# Patient Record
Sex: Female | Born: 1980 | Race: White | Hispanic: No | Marital: Single | Smoking: Former smoker
Health system: Southern US, Community
[De-identification: ages and names within clinical notes are randomized; demographics above are authoritative.]

## PROBLEM LIST (undated history)

## (undated) DIAGNOSIS — F419 Anxiety disorder, unspecified: Secondary | ICD-10-CM

## (undated) DIAGNOSIS — F1920 Other psychoactive substance dependence, uncomplicated: Secondary | ICD-10-CM

## (undated) DIAGNOSIS — B1920 Unspecified viral hepatitis C without hepatic coma: Secondary | ICD-10-CM

## (undated) DIAGNOSIS — F329 Major depressive disorder, single episode, unspecified: Secondary | ICD-10-CM

## (undated) DIAGNOSIS — F32A Depression, unspecified: Secondary | ICD-10-CM

## (undated) HISTORY — DX: Other psychoactive substance dependence, uncomplicated: F19.20

## (undated) HISTORY — DX: Depression, unspecified: F32.A

## (undated) HISTORY — DX: Unspecified viral hepatitis C without hepatic coma: B19.20

## (undated) HISTORY — DX: Anxiety disorder, unspecified: F41.9

## (undated) HISTORY — DX: Major depressive disorder, single episode, unspecified: F32.9

---

## 2008-07-23 HISTORY — PX: CHOLECYSTECTOMY: SHX55

## 2013-05-11 LAB — HM HIV SCREENING LAB: HM HIV SCREENING: NEGATIVE

## 2013-05-13 LAB — HM PAP SMEAR: HM PAP: NEGATIVE

## 2013-05-14 LAB — HM HEPATITIS C SCREENING LAB: HM HEPATITIS C SCREENING: POSITIVE

## 2016-04-08 ENCOUNTER — Emergency Department (HOSPITAL_COMMUNITY): Payer: Self-pay

## 2016-04-08 ENCOUNTER — Emergency Department (HOSPITAL_COMMUNITY)
Admission: EM | Admit: 2016-04-08 | Discharge: 2016-04-08 | Disposition: A | Discharge status: Home / Self Care | Payer: Self-pay | Attending: Emergency Medicine | Admitting: Emergency Medicine

## 2016-04-08 ENCOUNTER — Encounter (HOSPITAL_COMMUNITY): Payer: Self-pay

## 2016-04-08 DIAGNOSIS — R0789 Other chest pain: Secondary | ICD-10-CM | POA: Insufficient documentation

## 2016-04-08 DIAGNOSIS — R079 Chest pain, unspecified: Secondary | ICD-10-CM

## 2016-04-08 DIAGNOSIS — F17200 Nicotine dependence, unspecified, uncomplicated: Secondary | ICD-10-CM | POA: Insufficient documentation

## 2016-04-08 DIAGNOSIS — B349 Viral infection, unspecified: Secondary | ICD-10-CM

## 2016-04-08 LAB — BASIC METABOLIC PANEL WITH GFR
Anion gap: 5 (ref 5–15)
BUN: 6 mg/dL (ref 6–20)
CO2: 24 mmol/L (ref 22–32)
Calcium: 9 mg/dL (ref 8.9–10.3)
Chloride: 106 mmol/L (ref 101–111)
Creatinine, Ser: 0.7 mg/dL (ref 0.44–1.00)
GFR calc Af Amer: >60 mL/min
GFR calc non Af Amer: >60 mL/min
Glucose, Bld: 110 mg/dL — ABNORMAL HIGH (ref 65–99)
Potassium: 4 mmol/L (ref 3.5–5.1)
Sodium: 135 mmol/L (ref 135–145)

## 2016-04-08 LAB — I-STAT TROPONIN, ED: Troponin i, poc: 0 ng/mL (ref 0.00–0.08)

## 2016-04-08 LAB — CBC
HEMATOCRIT: 41.3 % (ref 36.0–46.0)
HEMOGLOBIN: 14 g/dL (ref 12.0–15.0)
MCH: 30.5 pg (ref 26.0–34.0)
MCHC: 33.9 g/dL (ref 30.0–36.0)
MCV: 90 fL (ref 78.0–100.0)
Platelets: 237 10*3/uL (ref 150–400)
RBC: 4.59 MIL/uL (ref 3.87–5.11)
RDW: 11.8 % (ref 11.5–15.5)
WBC: 5.1 10*3/uL (ref 4.0–10.5)

## 2016-04-08 NOTE — ED Triage Notes (Signed)
Patient here with 3 days of chills, body aches and sharp chest pain since am. States that the pain is worse with inspiration. Denies cough, denies trauma

## 2016-04-08 NOTE — ED Provider Notes (Signed)
[facility] DEPT Provider Note   CSN: [id number] Arrival date & time: 04/08/16  1357   History   Chief Complaint Chief Complaint  Patient presents with  . Chest Pain  . Generalized Body Aches    HPI Yvonne Morse is a 35 y.o. female.  The history is provided by the patient.   35 yo female with no significant past medical or surgical history presenting with myalgias, subjective fever and chills, chest pain. Onset about 3 days ago. Worsening since then. Moderate severity. Worsened by exertion. Alleviated somewhat by resting. Described as diffuse body aches, hot and cold chills and subjective fevers, a slight feeling of shortness of breath without cough or rhinorrhea, and 2 days of a middle chest tightness. 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 No leg swelling, history of DVT/PE, or estrogen use. No recent surgeries or immobilization.   History reviewed. No pertinent past medical history.  There are no active problems to display for this patient.   History reviewed. No pertinent surgical history.  OB History    No data available       Home Medications    Prior to Admission medications   Not on File    Family History No family history on file.  Social History Social History  Substance Use Topics  . Smoking status: Current Every Day Smoker  . Smokeless tobacco: Never Used  . Alcohol use Not on file     Allergies   Review of patient's allergies indicates no known allergies.   Review of Systems Review of Systems  Constitutional: Positive for chills and fever.  HENT: Negative for rhinorrhea and sore throat.   Respiratory: Positive for shortness of breath.   Cardiovascular: Positive for chest pain. Negative for palpitations and leg swelling.  Gastrointestinal: Negative for abdominal pain, nausea and vomiting.  Skin: Negative for rash.  All other systems reviewed and are  negative.    Physical Exam Updated Vital Signs BP 105/70 (BP Location: Right Arm)   Pulse 80   Temp 98.5 F (36.9 C) (Oral)   Resp 20   SpO2 98%   Physical Exam  Constitutional: She appears well-developed and well-nourished. No distress.  HENT:  Head: Normocephalic and atraumatic.  Mouth/Throat: Oropharynx is clear and moist.  Eyes: Conjunctivae are normal.  Neck: Neck supple.  Cardiovascular: Normal rate and regular rhythm.   No murmur heard. Pulmonary/Chest: Effort normal and breath sounds normal. No respiratory distress.  Abdominal: Soft. There is no tenderness.  Musculoskeletal: She exhibits no edema.  Lymphadenopathy:    She has no cervical adenopathy.  Neurological: She is alert.  Skin: Skin is warm and dry.  Psychiatric: She has a normal mood and affect.  Nursing note and vitals reviewed.    ED Treatments / Results  Labs (all labs ordered are listed, but only abnormal results are displayed) Labs Reviewed  BASIC METABOLIC PANEL - Abnormal; Notable for the following:       Result Value   Glucose, Bld 110 (*)    All other components within normal limits  CBC  I-STAT TROPOININ, ED    EKG  EKG Interpretation None       Radiology Dg Chest 2 View  Result Date: 04/08/2016 CLINICAL DATA:  Chest pain for 4 days. EXAM: CHEST  2 VIEW COMPARISON:  None. FINDINGS: The cardiomediastinal silhouette is unremarkable. There is no evidence of focal airspace disease,  pulmonary edema, suspicious pulmonary nodule/mass, pleural effusion, or pneumothorax. No acute bony abnormalities are identified. IMPRESSION: No active cardiopulmonary disease. Electronically Signed   By: Harmon PierJeffrey  Hu M.D.   On: 04/08/2016 15:38    Procedures Procedures (including critical care time)  Medications Ordered in ED Medications - No data to display   Initial Impression / Assessment and Plan / ED Course  I have reviewed the triage vital signs and the nursing notes.  Pertinent labs & imaging  results that were available during my care of the patient were reviewed by me and considered in my medical decision making (see chart for details).  Clinical Course   35 yo female with no significant past medical or surgical history presenting with myalgias, subjective fever and chills, chest pain as above. She is afebrile with stable vital signs. Unremarkable exam. No symptoms currently. Chest x-ray without any acute disease. EKG without acute ischemic changes or abnormalities. She is low risk for DVT/PE per [facility] and well's criteria. Impression is likely viral syndrome. Symptomatic management at home. Work note given. Discharged in stable condition with return precautions.  Case discussed with Dr. Hyacinth MeekerMiller who oversaw management of this patient.   Final Clinical Impressions(s) / ED Diagnoses   Final diagnoses:  Viral syndrome  Chest pain, unspecified chest pain type    New Prescriptions There are no discharge medications for this patient.    Urban GibsonJenny Tabor Bartram, MD 04/09/16 [id number]    Eber HongBrian Miller, MD 04/09/16 [phone]

## 2016-10-08 DIAGNOSIS — F411 Generalized anxiety disorder: Secondary | ICD-10-CM | POA: Diagnosis not present

## 2016-10-08 DIAGNOSIS — F1120 Opioid dependence, uncomplicated: Secondary | ICD-10-CM | POA: Diagnosis not present

## 2016-10-29 DIAGNOSIS — F411 Generalized anxiety disorder: Secondary | ICD-10-CM | POA: Diagnosis not present

## 2016-10-29 DIAGNOSIS — F1120 Opioid dependence, uncomplicated: Secondary | ICD-10-CM | POA: Diagnosis not present

## 2017-01-04 DIAGNOSIS — Z3046 Encounter for surveillance of implantable subdermal contraceptive: Secondary | ICD-10-CM | POA: Diagnosis not present

## 2017-01-04 DIAGNOSIS — R635 Abnormal weight gain: Secondary | ICD-10-CM | POA: Diagnosis not present

## 2017-03-03 IMAGING — CR DG CHEST 2V
2 series · 2 of 2 positions shown · non-contrast
Comparison: None.

CLINICAL DATA: Chest pain for 4 days.

EXAM:
CHEST  2 VIEW

[chest pa]
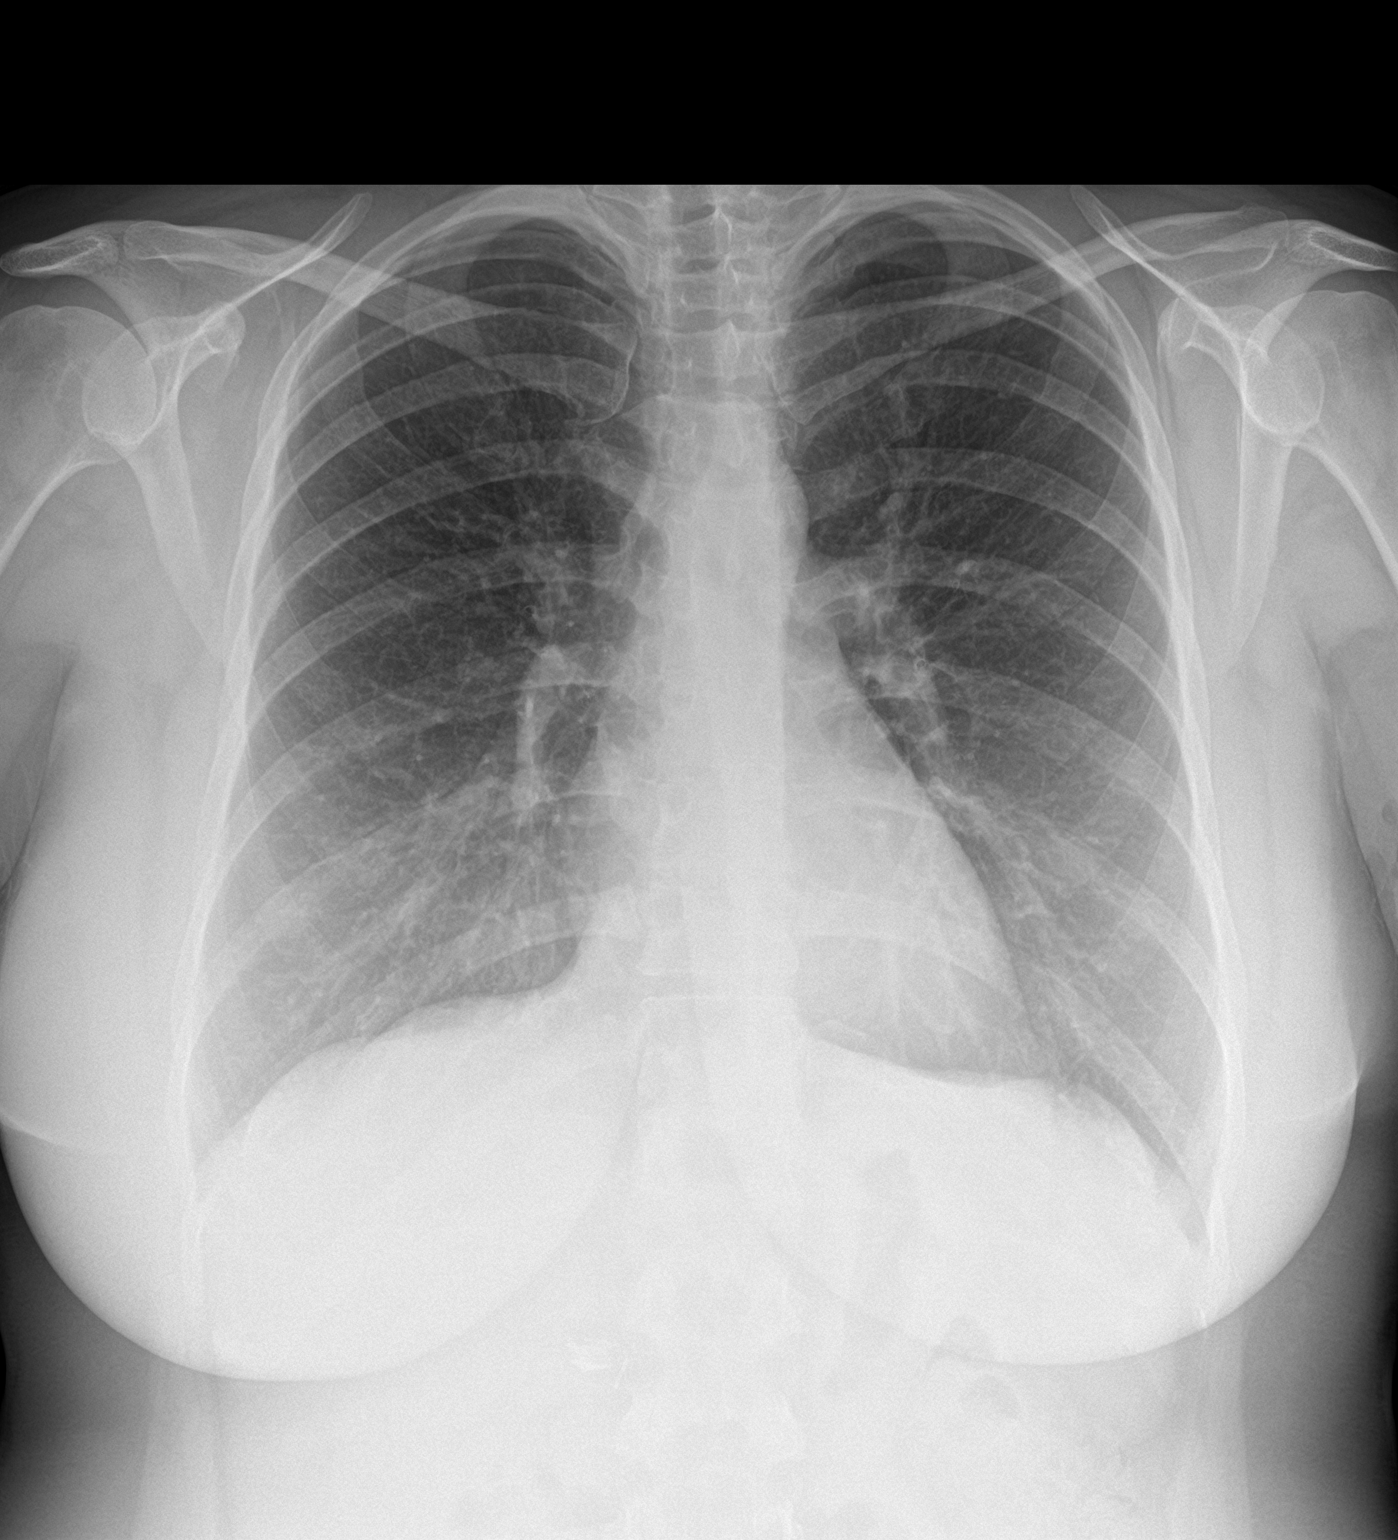

[chest lat]
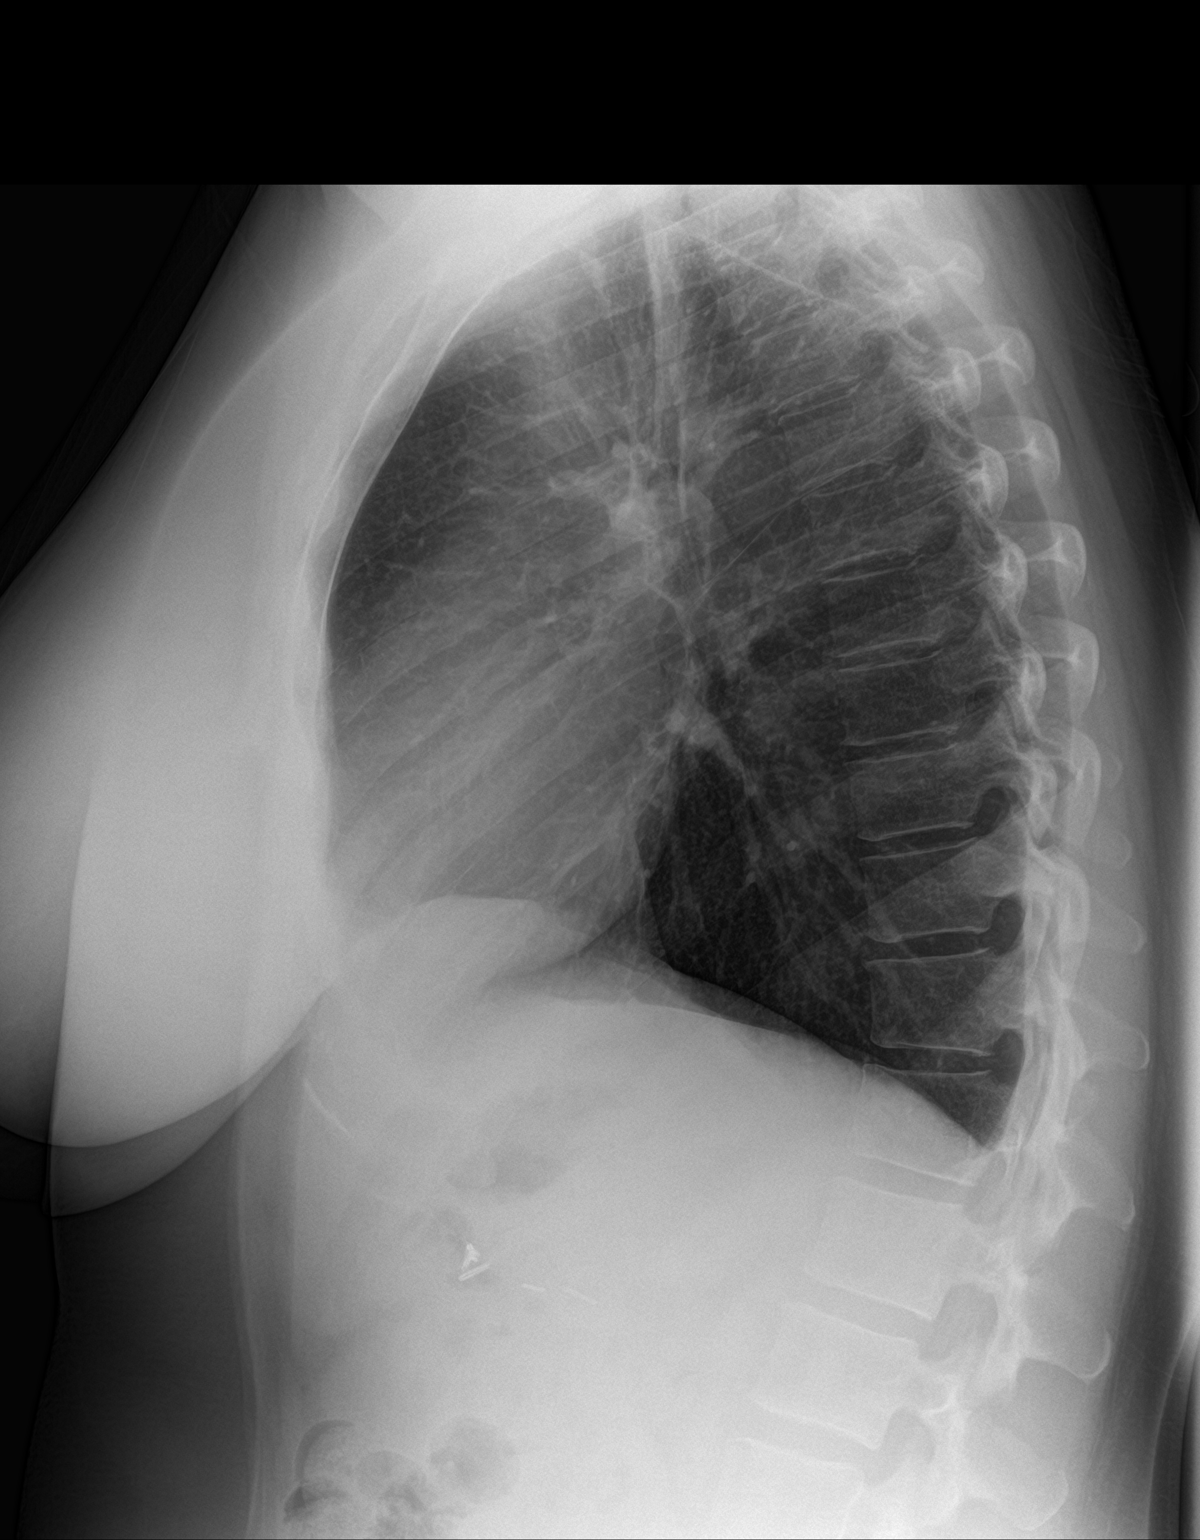

[2 of 2 positions shown; findings below may reference images not displayed]

FINDINGS: The cardiomediastinal silhouette is unremarkable.

There is no evidence of focal airspace disease, pulmonary edema,
suspicious pulmonary nodule/mass, pleural effusion, or pneumothorax.
No acute bony abnormalities are identified.
IMPRESSION: No active cardiopulmonary disease.

## 2017-03-27 DIAGNOSIS — Z8719 Personal history of other diseases of the digestive system: Secondary | ICD-10-CM | POA: Diagnosis not present

## 2017-03-27 DIAGNOSIS — Z3041 Encounter for surveillance of contraceptive pills: Secondary | ICD-10-CM | POA: Diagnosis not present

## 2017-03-27 LAB — BASIC METABOLIC PANEL
BUN: 14 (ref 4–21)
Creatinine: 0.9 (ref 0.5–1.1)
GLUCOSE: 98
SODIUM: 135 — AB (ref 137–147)

## 2017-03-27 LAB — HEPATIC FUNCTION PANEL: BILIRUBIN, TOTAL: 0.2

## 2017-11-22 DIAGNOSIS — F411 Generalized anxiety disorder: Secondary | ICD-10-CM | POA: Diagnosis not present

## 2017-11-22 DIAGNOSIS — F331 Major depressive disorder, recurrent, moderate: Secondary | ICD-10-CM | POA: Diagnosis not present

## 2017-11-22 DIAGNOSIS — F1121 Opioid dependence, in remission: Secondary | ICD-10-CM | POA: Diagnosis not present

## 2017-12-18 DIAGNOSIS — F411 Generalized anxiety disorder: Secondary | ICD-10-CM | POA: Diagnosis not present

## 2017-12-18 DIAGNOSIS — F1121 Opioid dependence, in remission: Secondary | ICD-10-CM | POA: Diagnosis not present

## 2017-12-18 DIAGNOSIS — F331 Major depressive disorder, recurrent, moderate: Secondary | ICD-10-CM | POA: Diagnosis not present

## 2017-12-19 ENCOUNTER — Ambulatory Visit (INDEPENDENT_AMBULATORY_CARE_PROVIDER_SITE_OTHER): Payer: BLUE CROSS/BLUE SHIELD | Admitting: Family Medicine

## 2017-12-19 ENCOUNTER — Encounter: Payer: Self-pay | Admitting: Family Medicine

## 2017-12-19 VITALS — BP 114/68 | HR 94 | Temp 98.2°F | Ht 62.0 in | Wt 224.8 lb

## 2017-12-19 DIAGNOSIS — IMO0001 Reserved for inherently not codable concepts without codable children: Secondary | ICD-10-CM

## 2017-12-19 DIAGNOSIS — R7989 Other specified abnormal findings of blood chemistry: Secondary | ICD-10-CM

## 2017-12-19 DIAGNOSIS — R21 Rash and other nonspecific skin eruption: Secondary | ICD-10-CM

## 2017-12-19 DIAGNOSIS — F3342 Major depressive disorder, recurrent, in full remission: Secondary | ICD-10-CM | POA: Diagnosis not present

## 2017-12-19 DIAGNOSIS — F1921 Other psychoactive substance dependence, in remission: Secondary | ICD-10-CM | POA: Diagnosis not present

## 2017-12-19 DIAGNOSIS — F1020 Alcohol dependence, uncomplicated: Secondary | ICD-10-CM

## 2017-12-19 DIAGNOSIS — B1920 Unspecified viral hepatitis C without hepatic coma: Secondary | ICD-10-CM | POA: Diagnosis not present

## 2017-12-19 DIAGNOSIS — F32A Depression, unspecified: Secondary | ICD-10-CM | POA: Insufficient documentation

## 2017-12-19 DIAGNOSIS — R945 Abnormal results of liver function studies: Secondary | ICD-10-CM

## 2017-12-19 DIAGNOSIS — F329 Major depressive disorder, single episode, unspecified: Secondary | ICD-10-CM | POA: Insufficient documentation

## 2017-12-19 MED ORDER — METHYLPREDNISOLONE ACETATE 80 MG/ML IJ SUSP
80.0000 mg | Freq: Once | INTRAMUSCULAR | Status: AC
Start: 2017-12-19 — End: 2017-12-19
  Administered 2017-12-19: 80 mg via INTRAMUSCULAR

## 2017-12-19 NOTE — Assessment & Plan Note (Addendum)
S: Unfortunately she is drinking approximately 70 beers a week. A/P: We had extended counseling on the importance of cutting out the alcohol.  Also discussed the importance of discussing with her psychiatrist Dr. Carver Fila.  We discussed the risks to her liver particularly with her obesity and hepatitis C  We found an AA meeting within 7 minutes of her home.  She agrees to cut intake in half weekly until she sees me back in about a month

## 2017-12-19 NOTE — Assessment & Plan Note (Signed)
S:was told had hepatitis C around 2012 when pregnant. has never had eval/treatment since then. IV drug use exposure.  A/P: We will update liver function tests, hepatitis evaluation.  We did hepatitis C antibody with reflex- if reflex shows active hepatitis C will refer to the infectious disease clinic.  We also discussed protecting her liver through cutting down on alcohol as well as weight loss.

## 2017-12-19 NOTE — Progress Notes (Signed)
Phone: [phone]  Subjective:  Patient presents today to establish care.  No recent PCP. Chief complaint-noted.   See problem oriented charting  The following were reviewed and entered/updated in epic: Past Medical History:  Diagnosis Date  . Anxiety   . Depression    zoloft, wellbutrin through Dr. Carver Fila  . 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   . Hepatitis C    untreated. was told had hepatitis C around 2012 when pregnant. has never had eval/treatment since then. IV drug use exposure.    Patient Active Problem List   Diagnosis Date Noted  . Alcoholism /alcohol abuse (HCC) 12/19/2017    Priority: High  . Drug addiction in remission [facility])     Priority: High  . Hepatitis C     Priority: Medium  . Depression     Priority: Medium   Past Surgical History:  Procedure Laterality Date  . CHOLECYSTECTOMY  2010    Family History  Problem Relation Age of Onset  . Asthma Mother   . Depression Mother   . Diabetes Mother   . Other Father        unknown to patient- she knows step dad only  . Alzheimer's disease Maternal Grandmother     Medications- reviewed and updated Current Outpatient Medications  Medication Sig Dispense Refill  . buPROPion (WELLBUTRIN XL) 150 MG 24 hr tablet Take 150 mg by mouth daily.  1  . drospirenone-ethinyl estradiol (YAZ,GIANVI,LORYNA) 3-0.02 MG tablet   7  . sertraline (ZOLOFT) 100 MG tablet Take 100 mg by mouth daily.  4  . SUBOXONE 2-0.5 MG FILM Take 12 mg by mouth daily.  0   No current facility-administered medications for this visit.     Allergies-reviewed and updated No Known Allergies  Social History   Social History Narrative   Single. Just she and boyfriend in the house.    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       Hobbies: time with boyfriend, netflix    ROS--Full ROS was completed Review of  Systems  Constitutional: Negative for chills and fever.  HENT: Negative for hearing loss and tinnitus.   Eyes: Negative for blurred vision and double vision.  Respiratory: Negative for sputum production and shortness of breath.   Cardiovascular: Negative for chest pain and palpitations.  Gastrointestinal: Negative for heartburn and nausea.  Genitourinary: Negative for dysuria and urgency.  Musculoskeletal: Negative for falls and neck pain.  Skin: Positive for itching and rash.  Neurological: Negative for dizziness and speech change.  Endo/Heme/Allergies: Negative for polydipsia. Does not bruise/bleed easily.  Psychiatric/Behavioral: Positive for depression (but controlled) and suicidal ideas (alcohol abuse).    Objective: BP 114/68 (BP Location: Left Arm, Patient Position: Sitting, Cuff Size: Normal)   Pulse 94   Temp 98.2 F (36.8 C) (Oral)   Ht  (1.575 m)   Wt 224 lb 12.8 oz (102 kg)   LMP 11/19/2017   SpO2 96%   BMI 41.12 kg/m  Gen: NAD, resting comfortably HEENT: Mucous membranes are moist. Oropharynx normal. TM normal. Eyes: sclera and lids normal, PERRLA Neck: no thyromegaly, no cervical lymphadenopathy CV: RRR no murmurs rubs or gallops Lungs: CTAB no crackles, wheeze, rhonchi Abdomen: soft/nontender/nondistended/normal bowel sounds. No rebound or guarding. Morbidly obese Ext: no edema Skin: warm, dry, bruised like appearance  to bilateral shins surrounded by erythema that is warm to touch (erythema does not photograph well) Neuro: 5/5 strength in upper and lower extremities, normal gait, normal reflexes      Assessment/Plan:  Rash S: Rash on both lower legs.  She denies new contacts or exposures such as change in soap or detergent.  After further questioning- she does admit to shaving her legs in an atypical way.  She had capris on at work and used her clippers after spraying them with disinfectant to cut the hairs off her leg.  Very itchy. Brownish  appearance in plaques then surrounded futher by erythema. Has had some hives. Been going on a week. Not worsening but not improving. Hydrocortisone not helpful ROS-not ill appearing, no fever/chills. No new medications. Not immunocompromised. No mucus membrane involvement.  A/P: I wonder if this could be a contact dermatitis given her recent use of clippers after spraying chemical on them.  The hyperpigmented area-I still cannot explain with this diagnosis.  I offered her a punch biopsy to get more information and she declines.  She would prefer to try steroid injection and follow-up if not improved.  I doubt bilateral cellulitis-if redness worsens would consider antibiotic though.  Drug addiction in remission [facility]) Mainly heroin and cocaine- has been drug free for 4 years through the help of Dr. Carver Fila.  She is on Suboxone through him.  He is not aware of her alcohol use-she does agree to update him on this as he could also be helpful in helping her stop this habit.  Hepatitis C S:was told had hepatitis C around 2012 when pregnant. has never had eval/treatment since then. IV drug use exposure.  A/P: We will update liver function tests, hepatitis evaluation.  We did hepatitis C antibody with reflex- if reflex shows active hepatitis C will refer to the infectious disease clinic.  We also discussed protecting her liver through cutting down on alcohol as well as weight loss.  Alcoholism /alcohol abuse (HCC) S: Unfortunately she is drinking approximately 70 beers a week. A/P: We had extended counseling on the importance of cutting out the alcohol.  Also discussed the importance of discussing with her psychiatrist Dr. Carver Fila.  We discussed the risks to her liver particularly with her obesity and hepatitis C  We found an AA meeting within 7 minutes of her home.  She agrees to cut intake in half weekly until she sees me back in about a month   Future Appointments  Date Time Provider Department Center    01/20/2018  3:30 PM Shelva Majestic, MD [facility]  Follow-up to check in on alcohol use.  Lab/Order associations: Rash - Plan: methylPREDNISolone acetate (DEPO-MEDROL) injection 80 mg, Comprehensive metabolic panel, CBC with Differential/Platelet  Hepatitis C virus infection without hepatic coma, unspecified chronicity - Plan: Comprehensive metabolic panel, CBC with Differential/Platelet, Hepatitis C antibody, reflex, Hepatitis B surface antigen, Hepatitis B surface antibody, Hepatitis B core antibody, total, Hepatitis A antibody, total, Hepatitis A antibody, IgM  Elevated LFTs - Plan: Comprehensive metabolic panel, CBC with Differential/Platelet, Hepatitis C antibody, reflex, Hepatitis B surface antigen, Hepatitis B surface antibody, Hepatitis B core antibody, total, Hepatitis A antibody, total, Hepatitis A antibody, IgM  Meds ordered this encounter  Medications  . methylPREDNISolone acetate (DEPO-MEDROL) injection 80 mg   Time Stamp The duration of face-to-face time during this visit was greater than 45 (2:56 to 3:41) minutes. Greater than 50% of this time was spent in counseling, explanation of diagnosis, planning of further  management, and/or coordination of care including discussion of cutting on alcohol and importance, risks to her liver, risks of hepatitis C, importance of weight loss and risk for fatty liver..    Return precautions advised. Tana Conch, MD

## 2017-12-19 NOTE — Assessment & Plan Note (Signed)
Mainly heroin and cocaine- has been drug free for 4 years through the help of Dr. Carver Fila.  She is on Suboxone through him.  He is not aware of her alcohol use-she does agree to update him on this as he could also be helpful in helping her stop this habit.

## 2017-12-19 NOTE — Patient Instructions (Addendum)
Health Maintenance Due  Topic Date Due  . HIV Screening - Patient Declined 01/28/1995  . TETANUS/TDAP - Sign release of information at the check out desk for records from the correctional system to get a copy of all immunizations 01/28/1999  . PAP SMEAR - Scheduled for July with Deboraha Sprang  01/27/2001   Please stop by lab before you go  Try steroid shot- if not improving by the weekend or if symptoms worsen get in to see [place] or an urgent care ([facility] record of today)   Please tell Dr. Carver Fila about your alcohol use. Consider the AA meeting near your home. I would like to see you in 1 month regardless. You set a goal of going to 35 drinks within next week then 15-18 drinks then to 1 drink a day within 3 weeks.   You need to quit drinking and lose weight to help protect your liver- quitting alcohol will help with both

## 2017-12-20 LAB — CBC WITH DIFFERENTIAL/PLATELET
BASOS PCT: 0.7 % (ref 0.0–3.0)
Basophils Absolute: 0 10*3/uL (ref 0.0–0.1)
Eosinophils Absolute: 0.2 10*3/uL (ref 0.0–0.7)
Eosinophils Relative: 2.4 % (ref 0.0–5.0)
HCT: 38.6 % (ref 36.0–46.0)
Hemoglobin: 12.8 g/dL (ref 12.0–15.0)
Lymphocytes Relative: 18.6 % (ref 12.0–46.0)
Lymphs Abs: 1.2 10*3/uL (ref 0.7–4.0)
MCHC: 33.2 g/dL (ref 30.0–36.0)
MCV: 85.9 fl (ref 78.0–100.0)
MONO ABS: 0.5 10*3/uL (ref 0.1–1.0)
Monocytes Relative: 8.5 % (ref 3.0–12.0)
NEUTROS ABS: 4.4 10*3/uL (ref 1.4–7.7)
Neutrophils Relative %: 69.8 % (ref 43.0–77.0)
PLATELETS: 199 10*3/uL (ref 150.0–400.0)
RBC: 4.49 Mil/uL (ref 3.87–5.11)
RDW: 14.6 % (ref 11.5–15.5)
WBC: 6.3 10*3/uL (ref 4.0–10.5)

## 2017-12-20 LAB — HEPATITIS A ANTIBODY, TOTAL: Hepatitis A AB,Total: NONREACTIVE

## 2017-12-20 LAB — HEPATITIS B SURFACE ANTIGEN: HEP B S AG: NONREACTIVE

## 2017-12-20 LAB — COMPREHENSIVE METABOLIC PANEL
ALBUMIN: 3.9 g/dL (ref 3.5–5.2)
ALT: 65 U/L — ABNORMAL HIGH (ref 0–35)
AST: 78 U/L — AB (ref 0–37)
Alkaline Phosphatase: 98 U/L (ref 39–117)
BUN: 12 mg/dL (ref 6–23)
CHLORIDE: 101 meq/L (ref 96–112)
CO2: 22 meq/L (ref 19–32)
CREATININE: 0.55 mg/dL (ref 0.40–1.20)
Calcium: 9 mg/dL (ref 8.4–10.5)
GFR: 131.55 mL/min (ref >60.00)
GLUCOSE: 94 mg/dL (ref 70–99)
Potassium: 4.3 mEq/L (ref 3.5–5.1)
SODIUM: 136 meq/L (ref 135–145)
Total Bilirubin: 0.5 mg/dL (ref 0.2–1.2)
Total Protein: 8.4 g/dL — ABNORMAL HIGH (ref 6.0–8.3)

## 2017-12-20 LAB — HEPATITIS B CORE ANTIBODY, TOTAL: Hep B Core Total Ab: NONREACTIVE

## 2017-12-20 LAB — HEPATITIS B SURFACE ANTIBODY,QUALITATIVE: Hep B S Ab: NONREACTIVE

## 2017-12-20 LAB — HEPATITIS A ANTIBODY, IGM: Hep A IgM: NONREACTIVE

## 2017-12-21 NOTE — Progress Notes (Signed)
Patient is not immune to hepatitis A or hepatitis B-please set her up for Twinrix series of immunizations at 0, 1, 6 months.   Patient has elevations of liver function test-she needs to quit drinking as we already discussed.  Her hepatitis C test is still pending-hopefully we will have this sometime next week  Blood counts were normal  Please check in with her to see if she made it to any AA visits

## 2017-12-24 ENCOUNTER — Telehealth: Payer: Self-pay | Admitting: Family Medicine

## 2017-12-24 MED ORDER — PREDNISONE 20 MG PO TABS: ORAL_TABLET | ORAL | 0 refills | Status: AC

## 2017-12-24 NOTE — Addendum Note (Signed)
Addended by: Shelva MajesticHUNTER, Carnisha Feltz O on: 12/24/2017 06:18 PM   Modules accepted: Orders

## 2017-12-24 NOTE — Telephone Encounter (Signed)
I sent in prednisone but if she isnt improving by Friday or at latest on monday- we need to try to see her in office

## 2017-12-24 NOTE — Telephone Encounter (Signed)
See note.   Copied from CRM [id number]. Topic: Inquiry >> Dec 24, 2017  2:22 PM Windy KalataMichael, Taylor L, NT wrote: Reason for CRM: patient was seen on 5/30 by Dr. Durene CalHunter and had a rash, she states it is not better and would like to know can he call her prednisone or something else in. Please advise.   [facility] [place] [address] [place] [address] Phone: [phone] Fax: [phone]

## 2017-12-24 NOTE — Telephone Encounter (Signed)
Per your visit note on 12/19/17:  Rash S: Rash on both lower legs.  She denies new contacts or exposures such as change in soap or detergent.  After further questioning- she does admit to shaving her legs in an atypical way.  She had capris on at work and used her clippers after spraying them with disinfectant to cut the hairs off her leg.  Very itchy. Brownish appearance in plaques then surrounded futher by erythema. Has had some hives. Been going on a week. Not worsening but not improving. Hydrocortisone not helpful ROS-not ill appearing, no fever/chills. No new medications. Not immunocompromised. No mucus membrane involvement.  A/P: I wonder if this could be a contact dermatitis given her recent use of clippers after spraying chemical on them.  The hyperpigmented area-I still cannot explain with this diagnosis.  I offered her a punch biopsy to get more information and she declines.  She would prefer to try steroid injection and follow-up if not improved.  I doubt bilateral cellulitis-if redness worsens would consider antibiotic though.   There is a picture of the rash in the chart also.  Please advise

## 2017-12-25 NOTE — Telephone Encounter (Signed)
Called and let patient know that prednisone had been sent in for her. She verbalized understanding and we discussed follow up care of not improved by Friday or Monday

## 2018-01-07 ENCOUNTER — Encounter: Payer: Self-pay | Admitting: Family Medicine

## 2018-01-09 ENCOUNTER — Other Ambulatory Visit: Payer: Self-pay

## 2018-01-09 DIAGNOSIS — K732 Chronic active hepatitis, not elsewhere classified: Secondary | ICD-10-CM

## 2018-01-09 DIAGNOSIS — B1920 Unspecified viral hepatitis C without hepatic coma: Secondary | ICD-10-CM

## 2018-01-09 NOTE — Telephone Encounter (Signed)
Called patient to schedule a repeat Hep C antibody lab per Dr. Durene CalHunter. Patient stated "I have a appointment in a couple weeks we can just do it then." I told her she would just have to come in give blood and leave. She then stated " I don't have time to just come in for that Ill just do it at my visit in 2 weeks. I said okay.

## 2018-01-15 DIAGNOSIS — F1121 Opioid dependence, in remission: Secondary | ICD-10-CM | POA: Diagnosis not present

## 2018-01-15 DIAGNOSIS — F411 Generalized anxiety disorder: Secondary | ICD-10-CM | POA: Diagnosis not present

## 2018-01-15 DIAGNOSIS — F331 Major depressive disorder, recurrent, moderate: Secondary | ICD-10-CM | POA: Diagnosis not present

## 2018-01-17 DIAGNOSIS — S39012A Strain of muscle, fascia and tendon of lower back, initial encounter: Secondary | ICD-10-CM | POA: Diagnosis not present

## 2018-01-17 DIAGNOSIS — M5416 Radiculopathy, lumbar region: Secondary | ICD-10-CM | POA: Diagnosis not present

## 2018-01-20 ENCOUNTER — Ambulatory Visit: Payer: BLUE CROSS/BLUE SHIELD | Admitting: Family Medicine

## 2018-01-21 ENCOUNTER — Encounter: Payer: Self-pay | Admitting: Family Medicine

## 2018-01-21 NOTE — Progress Notes (Signed)
Patient cancelled same day for visit and didn't reschedule- we need to get her back in

## 2018-01-22 NOTE — Progress Notes (Signed)
LVM to schedule

## 2018-02-03 DIAGNOSIS — M5417 Radiculopathy, lumbosacral region: Secondary | ICD-10-CM | POA: Diagnosis not present

## 2018-02-03 DIAGNOSIS — M545 Low back pain: Secondary | ICD-10-CM | POA: Diagnosis not present

## 2018-02-04 ENCOUNTER — Ambulatory Visit: Payer: BLUE CROSS/BLUE SHIELD | Admitting: Physician Assistant

## 2018-02-06 DIAGNOSIS — M5417 Radiculopathy, lumbosacral region: Secondary | ICD-10-CM | POA: Diagnosis not present

## 2018-02-06 DIAGNOSIS — M545 Low back pain: Secondary | ICD-10-CM | POA: Diagnosis not present

## 2018-02-12 DIAGNOSIS — F331 Major depressive disorder, recurrent, moderate: Secondary | ICD-10-CM | POA: Diagnosis not present

## 2018-02-12 DIAGNOSIS — F411 Generalized anxiety disorder: Secondary | ICD-10-CM | POA: Diagnosis not present

## 2018-02-12 DIAGNOSIS — F1121 Opioid dependence, in remission: Secondary | ICD-10-CM | POA: Diagnosis not present

## 2018-02-20 ENCOUNTER — Encounter: Payer: Self-pay | Admitting: Family Medicine

## 2018-02-25 ENCOUNTER — Other Ambulatory Visit: Payer: BLUE CROSS/BLUE SHIELD

## 2018-03-07 ENCOUNTER — Other Ambulatory Visit: Payer: BLUE CROSS/BLUE SHIELD

## 2018-03-12 DIAGNOSIS — F1121 Opioid dependence, in remission: Secondary | ICD-10-CM | POA: Diagnosis not present

## 2018-03-12 DIAGNOSIS — F411 Generalized anxiety disorder: Secondary | ICD-10-CM | POA: Diagnosis not present

## 2018-03-12 DIAGNOSIS — F331 Major depressive disorder, recurrent, moderate: Secondary | ICD-10-CM | POA: Diagnosis not present

## 2018-04-02 DIAGNOSIS — F411 Generalized anxiety disorder: Secondary | ICD-10-CM | POA: Diagnosis not present

## 2018-04-02 DIAGNOSIS — F331 Major depressive disorder, recurrent, moderate: Secondary | ICD-10-CM | POA: Diagnosis not present

## 2018-04-02 DIAGNOSIS — F1121 Opioid dependence, in remission: Secondary | ICD-10-CM | POA: Diagnosis not present

## 2018-04-07 ENCOUNTER — Encounter: Payer: Self-pay | Admitting: Family Medicine

## 2018-04-08 NOTE — Progress Notes (Signed)
Patient is scheduled for 9/26

## 2018-04-16 DIAGNOSIS — F411 Generalized anxiety disorder: Secondary | ICD-10-CM | POA: Diagnosis not present

## 2018-04-16 DIAGNOSIS — F331 Major depressive disorder, recurrent, moderate: Secondary | ICD-10-CM | POA: Diagnosis not present

## 2018-04-16 DIAGNOSIS — F1121 Opioid dependence, in remission: Secondary | ICD-10-CM | POA: Diagnosis not present

## 2018-04-17 ENCOUNTER — Ambulatory Visit: Payer: BLUE CROSS/BLUE SHIELD | Admitting: Family Medicine

## 2018-05-08 NOTE — Progress Notes (Signed)
Left vm for patient to reschedule.

## 2018-05-08 NOTE — Progress Notes (Signed)
Please contact patient to schedule follow up appointment per Dr. Durene Cal

## 2018-07-02 DIAGNOSIS — Z79899 Other long term (current) drug therapy: Secondary | ICD-10-CM | POA: Diagnosis not present

## 2018-07-08 ENCOUNTER — Ambulatory Visit: Payer: BLUE CROSS/BLUE SHIELD | Admitting: Family Medicine

## 2018-07-09 DIAGNOSIS — Z79899 Other long term (current) drug therapy: Secondary | ICD-10-CM | POA: Diagnosis not present

## 2018-07-10 DIAGNOSIS — R945 Abnormal results of liver function studies: Secondary | ICD-10-CM | POA: Diagnosis not present

## 2018-07-10 DIAGNOSIS — R768 Other specified abnormal immunological findings in serum: Secondary | ICD-10-CM | POA: Diagnosis not present

## 2018-07-10 DIAGNOSIS — D689 Coagulation defect, unspecified: Secondary | ICD-10-CM | POA: Diagnosis not present

## 2018-07-10 DIAGNOSIS — Z79899 Other long term (current) drug therapy: Secondary | ICD-10-CM | POA: Diagnosis not present

## 2018-07-11 ENCOUNTER — Ambulatory Visit: Payer: BLUE CROSS/BLUE SHIELD | Admitting: Family Medicine

## 2018-08-01 ENCOUNTER — Telehealth: Payer: Self-pay | Admitting: Family Medicine

## 2018-08-01 NOTE — Telephone Encounter (Signed)
----- Message from Frann Rider sent at 08/01/2018 10:05 AM EST ----- So I called her and she said she had a new doctor... ----- Message ----- From: Shelva Majestic, MD Sent: 07/30/2018   5:04 PM EST To: Frann Rider  Can we get her scheduled sometime in next month or two for follow up?  Thanks, Jeannett Senior  ----- Message ----- From: Era Skeen, CMA Sent: 01/09/2018   8:18 AM EST To: Shelva Majestic, MD  Called patient to schedule a repeat Hep C antibody lab per Dr. Durene Cal. Patient stated "I have a appointment in a couple weeks we can just do it then." I told her she would just have to come in give blood and leave. She then stated " I don't have time to just come in for that Ill just do it at my visit in 2 weeks. I said okay.  Her appointment is July 1st ----- Message ----- From: Shelva Majestic, MD Sent: 01/08/2018   4:24 PM To: Greig Right, Callie Lavetta Nielsen, RT, #  Perfect! Jamie/Aria- can you order [id number] HCV antibody under hepatitis C and have patient come in for this?  Tana Conch  ----- Message ----- From: Rogers Seeds, RT Sent: 01/08/2018   4:18 PM To: Greig Right, Shelva Majestic, MD, #  My understanding from Dyane Dustman is that [phone] does reflex. I believe we have had positive Hep Cs come back and they reflexed using that test code. It just takes a few extra days to get the reflex results. It doesn't say "reflex" in Epic, but that test code is supposed to reflex. In the [facility] it states "If Hepatitis C Antibody is reactive, then Hepatitis C Viral RNA, Quantitative, Real-Time PCR will be performed."  If you have noticed this test code is not reflexing when your order it then please let me know and I can contact our [facility] rep to see what the issue is.   Thank you, Callie  ----- Message ----- From: Shelva Majestic, MD Sent: 01/08/2018   4:10 PM To: Greig Right, Rogers Seeds, RT, #  Ok so the issue here is that I did not  want [phone]- as it does not reflex. She has hepatitis C so I need the reflex to able to see what her values are for RNA.   Regardless- team can you consult with lab and get patient back in for this test? Im fine with it being the one ordered if we can change resulting agency. Since I dont have any results in system I really want the initial HCV antibody as well as follow up test.   Tana Conch  ----- Message ----- From: Rogers Seeds, RT Sent: 01/08/2018   7:16 AM To: Greig Right, Shelva Majestic, MD  It looks like it was not ordered correctly at visit. The resulting agency says "other" instead of "[facility]" which means it wouldn't have shown up on the requisition. Typically I catch that mistake when it happens, but looks like I overlooked it. She will have to come back for a re-draw because we cannot add it on. Sorry.  We just need to make sure everyone on the team updates their lab preference list with [phone] and hopefully that mistake won't happen again.   Thanks, [employer]   ----- Message ----- From: Shelva Majestic, MD Sent: 01/07/2018   8:15 PM To: Greig Right, Callie Lavetta Nielsen, RT  Any ideas  why HCV never resulted?

## 2018-08-01 NOTE — Telephone Encounter (Signed)
Patient has chosen to seek care with new physician. We removed my name from care team/PCP

## 2018-08-08 DIAGNOSIS — Z79899 Other long term (current) drug therapy: Secondary | ICD-10-CM | POA: Diagnosis not present

## 2018-08-19 DIAGNOSIS — Z23 Encounter for immunization: Secondary | ICD-10-CM | POA: Diagnosis not present

## 2018-08-19 DIAGNOSIS — R945 Abnormal results of liver function studies: Secondary | ICD-10-CM | POA: Diagnosis not present

## 2018-08-19 DIAGNOSIS — B182 Chronic viral hepatitis C: Secondary | ICD-10-CM | POA: Diagnosis not present

## 2018-09-08 DIAGNOSIS — Z79899 Other long term (current) drug therapy: Secondary | ICD-10-CM | POA: Diagnosis not present

## 2018-10-07 DIAGNOSIS — Z79899 Other long term (current) drug therapy: Secondary | ICD-10-CM | POA: Diagnosis not present

## 2018-11-06 DIAGNOSIS — Z79899 Other long term (current) drug therapy: Secondary | ICD-10-CM | POA: Diagnosis not present

## 2018-11-06 DIAGNOSIS — Z23 Encounter for immunization: Secondary | ICD-10-CM | POA: Diagnosis not present

## 2018-11-13 DIAGNOSIS — Z79899 Other long term (current) drug therapy: Secondary | ICD-10-CM | POA: Diagnosis not present

## 2018-11-13 DIAGNOSIS — F902 Attention-deficit hyperactivity disorder, combined type: Secondary | ICD-10-CM | POA: Diagnosis not present

## 2018-11-13 DIAGNOSIS — F419 Anxiety disorder, unspecified: Secondary | ICD-10-CM | POA: Diagnosis not present

## 2018-11-13 DIAGNOSIS — F319 Bipolar disorder, unspecified: Secondary | ICD-10-CM | POA: Diagnosis not present

## 2018-11-13 DIAGNOSIS — F329 Major depressive disorder, single episode, unspecified: Secondary | ICD-10-CM | POA: Diagnosis not present

## 2018-12-08 DIAGNOSIS — Z79899 Other long term (current) drug therapy: Secondary | ICD-10-CM | POA: Diagnosis not present

## 2018-12-11 DIAGNOSIS — F329 Major depressive disorder, single episode, unspecified: Secondary | ICD-10-CM | POA: Diagnosis not present

## 2018-12-11 DIAGNOSIS — F319 Bipolar disorder, unspecified: Secondary | ICD-10-CM | POA: Diagnosis not present

## 2018-12-11 DIAGNOSIS — F419 Anxiety disorder, unspecified: Secondary | ICD-10-CM | POA: Diagnosis not present

## 2018-12-11 DIAGNOSIS — F902 Attention-deficit hyperactivity disorder, combined type: Secondary | ICD-10-CM | POA: Diagnosis not present

## 2019-01-06 DIAGNOSIS — R21 Rash and other nonspecific skin eruption: Secondary | ICD-10-CM | POA: Diagnosis not present

## 2019-01-06 DIAGNOSIS — F1199 Opioid use, unspecified with unspecified opioid-induced disorder: Secondary | ICD-10-CM | POA: Diagnosis not present

## 2019-01-06 DIAGNOSIS — Z79899 Other long term (current) drug therapy: Secondary | ICD-10-CM | POA: Diagnosis not present

## 2019-01-06 DIAGNOSIS — R635 Abnormal weight gain: Secondary | ICD-10-CM | POA: Diagnosis not present

## 2019-01-08 DIAGNOSIS — F419 Anxiety disorder, unspecified: Secondary | ICD-10-CM | POA: Diagnosis not present

## 2019-01-08 DIAGNOSIS — F319 Bipolar disorder, unspecified: Secondary | ICD-10-CM | POA: Diagnosis not present

## 2019-01-08 DIAGNOSIS — F329 Major depressive disorder, single episode, unspecified: Secondary | ICD-10-CM | POA: Diagnosis not present

## 2019-01-08 DIAGNOSIS — N912 Amenorrhea, unspecified: Secondary | ICD-10-CM | POA: Diagnosis not present

## 2019-01-08 DIAGNOSIS — F902 Attention-deficit hyperactivity disorder, combined type: Secondary | ICD-10-CM | POA: Diagnosis not present

## 2019-02-04 DIAGNOSIS — F419 Anxiety disorder, unspecified: Secondary | ICD-10-CM | POA: Diagnosis not present

## 2019-02-04 DIAGNOSIS — F902 Attention-deficit hyperactivity disorder, combined type: Secondary | ICD-10-CM | POA: Diagnosis not present

## 2019-02-04 DIAGNOSIS — F319 Bipolar disorder, unspecified: Secondary | ICD-10-CM | POA: Diagnosis not present

## 2019-02-04 DIAGNOSIS — F329 Major depressive disorder, single episode, unspecified: Secondary | ICD-10-CM | POA: Diagnosis not present

## 2019-02-05 DIAGNOSIS — M545 Low back pain: Secondary | ICD-10-CM | POA: Diagnosis not present

## 2019-02-05 DIAGNOSIS — Z79899 Other long term (current) drug therapy: Secondary | ICD-10-CM | POA: Diagnosis not present

## 2019-02-05 DIAGNOSIS — F1199 Opioid use, unspecified with unspecified opioid-induced disorder: Secondary | ICD-10-CM | POA: Diagnosis not present

## 2019-03-04 DIAGNOSIS — F902 Attention-deficit hyperactivity disorder, combined type: Secondary | ICD-10-CM | POA: Diagnosis not present

## 2019-03-07 DIAGNOSIS — F1199 Opioid use, unspecified with unspecified opioid-induced disorder: Secondary | ICD-10-CM | POA: Diagnosis not present

## 2019-03-07 DIAGNOSIS — Z79899 Other long term (current) drug therapy: Secondary | ICD-10-CM | POA: Diagnosis not present

## 2019-04-02 DIAGNOSIS — F902 Attention-deficit hyperactivity disorder, combined type: Secondary | ICD-10-CM | POA: Diagnosis not present

## 2019-04-02 DIAGNOSIS — F329 Major depressive disorder, single episode, unspecified: Secondary | ICD-10-CM | POA: Diagnosis not present

## 2019-04-02 DIAGNOSIS — F319 Bipolar disorder, unspecified: Secondary | ICD-10-CM | POA: Diagnosis not present

## 2019-04-02 DIAGNOSIS — F419 Anxiety disorder, unspecified: Secondary | ICD-10-CM | POA: Diagnosis not present

## 2019-04-08 DIAGNOSIS — Z79899 Other long term (current) drug therapy: Secondary | ICD-10-CM | POA: Diagnosis not present

## 2019-05-08 DIAGNOSIS — Z79899 Other long term (current) drug therapy: Secondary | ICD-10-CM | POA: Diagnosis not present

## 2019-06-03 DIAGNOSIS — F902 Attention-deficit hyperactivity disorder, combined type: Secondary | ICD-10-CM | POA: Diagnosis not present

## 2019-06-03 DIAGNOSIS — F411 Generalized anxiety disorder: Secondary | ICD-10-CM | POA: Diagnosis not present

## 2019-06-03 DIAGNOSIS — F319 Bipolar disorder, unspecified: Secondary | ICD-10-CM | POA: Diagnosis not present

## 2019-06-03 DIAGNOSIS — F329 Major depressive disorder, single episode, unspecified: Secondary | ICD-10-CM | POA: Diagnosis not present

## 2019-06-08 DIAGNOSIS — Z79899 Other long term (current) drug therapy: Secondary | ICD-10-CM | POA: Diagnosis not present

## 2019-07-01 DIAGNOSIS — Z9189 Other specified personal risk factors, not elsewhere classified: Secondary | ICD-10-CM | POA: Diagnosis not present

## 2019-07-01 DIAGNOSIS — U071 COVID-19: Secondary | ICD-10-CM | POA: Diagnosis not present

## 2019-07-01 DIAGNOSIS — R05 Cough: Secondary | ICD-10-CM | POA: Diagnosis not present

## 2019-07-07 DIAGNOSIS — Z79899 Other long term (current) drug therapy: Secondary | ICD-10-CM | POA: Diagnosis not present

## 2022-09-20 DIAGNOSIS — F419 Anxiety disorder, unspecified: Secondary | ICD-10-CM | POA: Diagnosis not present

## 2022-09-20 DIAGNOSIS — F1120 Opioid dependence, uncomplicated: Secondary | ICD-10-CM | POA: Diagnosis not present

## 2022-09-20 DIAGNOSIS — F32A Depression, unspecified: Secondary | ICD-10-CM | POA: Diagnosis not present

## 2022-09-20 DIAGNOSIS — F909 Attention-deficit hyperactivity disorder, unspecified type: Secondary | ICD-10-CM | POA: Diagnosis not present

## 2022-10-21 DIAGNOSIS — F32A Depression, unspecified: Secondary | ICD-10-CM | POA: Diagnosis not present

## 2022-10-21 DIAGNOSIS — F909 Attention-deficit hyperactivity disorder, unspecified type: Secondary | ICD-10-CM | POA: Diagnosis not present

## 2022-10-21 DIAGNOSIS — F419 Anxiety disorder, unspecified: Secondary | ICD-10-CM | POA: Diagnosis not present

## 2022-10-21 DIAGNOSIS — F1120 Opioid dependence, uncomplicated: Secondary | ICD-10-CM | POA: Diagnosis not present

## 2022-11-20 DIAGNOSIS — F909 Attention-deficit hyperactivity disorder, unspecified type: Secondary | ICD-10-CM | POA: Diagnosis not present

## 2022-11-20 DIAGNOSIS — F419 Anxiety disorder, unspecified: Secondary | ICD-10-CM | POA: Diagnosis not present

## 2022-11-20 DIAGNOSIS — F32A Depression, unspecified: Secondary | ICD-10-CM | POA: Diagnosis not present

## 2022-11-20 DIAGNOSIS — F1120 Opioid dependence, uncomplicated: Secondary | ICD-10-CM | POA: Diagnosis not present

## 2022-12-21 DIAGNOSIS — F1120 Opioid dependence, uncomplicated: Secondary | ICD-10-CM | POA: Diagnosis not present

## 2022-12-21 DIAGNOSIS — F32A Depression, unspecified: Secondary | ICD-10-CM | POA: Diagnosis not present

## 2022-12-21 DIAGNOSIS — F909 Attention-deficit hyperactivity disorder, unspecified type: Secondary | ICD-10-CM | POA: Diagnosis not present

## 2022-12-21 DIAGNOSIS — F419 Anxiety disorder, unspecified: Secondary | ICD-10-CM | POA: Diagnosis not present

## 2023-01-20 DIAGNOSIS — F1120 Opioid dependence, uncomplicated: Secondary | ICD-10-CM | POA: Diagnosis not present

## 2023-02-20 DIAGNOSIS — F32A Depression, unspecified: Secondary | ICD-10-CM | POA: Diagnosis not present

## 2023-02-20 DIAGNOSIS — F909 Attention-deficit hyperactivity disorder, unspecified type: Secondary | ICD-10-CM | POA: Diagnosis not present

## 2023-02-20 DIAGNOSIS — F419 Anxiety disorder, unspecified: Secondary | ICD-10-CM | POA: Diagnosis not present

## 2023-02-20 DIAGNOSIS — F1120 Opioid dependence, uncomplicated: Secondary | ICD-10-CM | POA: Diagnosis not present

## 2023-03-23 DIAGNOSIS — F419 Anxiety disorder, unspecified: Secondary | ICD-10-CM | POA: Diagnosis not present

## 2023-03-23 DIAGNOSIS — F1120 Opioid dependence, uncomplicated: Secondary | ICD-10-CM | POA: Diagnosis not present

## 2023-03-23 DIAGNOSIS — F909 Attention-deficit hyperactivity disorder, unspecified type: Secondary | ICD-10-CM | POA: Diagnosis not present

## 2023-03-23 DIAGNOSIS — F32A Depression, unspecified: Secondary | ICD-10-CM | POA: Diagnosis not present

## 2023-04-22 DIAGNOSIS — F419 Anxiety disorder, unspecified: Secondary | ICD-10-CM | POA: Diagnosis not present

## 2023-04-22 DIAGNOSIS — F909 Attention-deficit hyperactivity disorder, unspecified type: Secondary | ICD-10-CM | POA: Diagnosis not present

## 2023-04-22 DIAGNOSIS — F1120 Opioid dependence, uncomplicated: Secondary | ICD-10-CM | POA: Diagnosis not present

## 2023-04-22 DIAGNOSIS — F32A Depression, unspecified: Secondary | ICD-10-CM | POA: Diagnosis not present

## 2023-05-23 DIAGNOSIS — F909 Attention-deficit hyperactivity disorder, unspecified type: Secondary | ICD-10-CM | POA: Diagnosis not present

## 2023-05-23 DIAGNOSIS — F32A Depression, unspecified: Secondary | ICD-10-CM | POA: Diagnosis not present

## 2023-05-23 DIAGNOSIS — F419 Anxiety disorder, unspecified: Secondary | ICD-10-CM | POA: Diagnosis not present

## 2023-05-23 DIAGNOSIS — F1120 Opioid dependence, uncomplicated: Secondary | ICD-10-CM | POA: Diagnosis not present

## 2023-06-22 DIAGNOSIS — F32A Depression, unspecified: Secondary | ICD-10-CM | POA: Diagnosis not present

## 2023-06-22 DIAGNOSIS — F1120 Opioid dependence, uncomplicated: Secondary | ICD-10-CM | POA: Diagnosis not present

## 2023-06-22 DIAGNOSIS — F909 Attention-deficit hyperactivity disorder, unspecified type: Secondary | ICD-10-CM | POA: Diagnosis not present

## 2023-06-22 DIAGNOSIS — F419 Anxiety disorder, unspecified: Secondary | ICD-10-CM | POA: Diagnosis not present

## 2023-07-09 DIAGNOSIS — F411 Generalized anxiety disorder: Secondary | ICD-10-CM | POA: Diagnosis not present

## 2023-07-23 DIAGNOSIS — F1120 Opioid dependence, uncomplicated: Secondary | ICD-10-CM | POA: Diagnosis not present

## 2023-07-23 DIAGNOSIS — F32A Depression, unspecified: Secondary | ICD-10-CM | POA: Diagnosis not present

## 2023-07-23 DIAGNOSIS — F909 Attention-deficit hyperactivity disorder, unspecified type: Secondary | ICD-10-CM | POA: Diagnosis not present

## 2023-07-23 DIAGNOSIS — F419 Anxiety disorder, unspecified: Secondary | ICD-10-CM | POA: Diagnosis not present

## 2023-07-30 DIAGNOSIS — F411 Generalized anxiety disorder: Secondary | ICD-10-CM | POA: Diagnosis not present

## 2023-07-30 DIAGNOSIS — F330 Major depressive disorder, recurrent, mild: Secondary | ICD-10-CM | POA: Diagnosis not present

## 2023-07-30 DIAGNOSIS — F901 Attention-deficit hyperactivity disorder, predominantly hyperactive type: Secondary | ICD-10-CM | POA: Diagnosis not present

## 2023-08-07 ENCOUNTER — Ambulatory Visit: Payer: 59 | Admitting: Family Medicine

## 2023-08-23 DIAGNOSIS — F1120 Opioid dependence, uncomplicated: Secondary | ICD-10-CM | POA: Diagnosis not present

## 2023-08-27 DIAGNOSIS — F411 Generalized anxiety disorder: Secondary | ICD-10-CM | POA: Diagnosis not present

## 2023-08-27 DIAGNOSIS — F330 Major depressive disorder, recurrent, mild: Secondary | ICD-10-CM | POA: Diagnosis not present

## 2023-08-27 DIAGNOSIS — F901 Attention-deficit hyperactivity disorder, predominantly hyperactive type: Secondary | ICD-10-CM | POA: Diagnosis not present

## 2023-09-24 DIAGNOSIS — F330 Major depressive disorder, recurrent, mild: Secondary | ICD-10-CM | POA: Diagnosis not present

## 2023-09-24 DIAGNOSIS — F901 Attention-deficit hyperactivity disorder, predominantly hyperactive type: Secondary | ICD-10-CM | POA: Diagnosis not present

## 2023-09-24 DIAGNOSIS — F411 Generalized anxiety disorder: Secondary | ICD-10-CM | POA: Diagnosis not present

## 2023-09-27 DIAGNOSIS — F330 Major depressive disorder, recurrent, mild: Secondary | ICD-10-CM | POA: Diagnosis not present

## 2023-09-27 DIAGNOSIS — F901 Attention-deficit hyperactivity disorder, predominantly hyperactive type: Secondary | ICD-10-CM | POA: Diagnosis not present

## 2023-09-27 DIAGNOSIS — F411 Generalized anxiety disorder: Secondary | ICD-10-CM | POA: Diagnosis not present

## 2023-10-22 DIAGNOSIS — F330 Major depressive disorder, recurrent, mild: Secondary | ICD-10-CM | POA: Diagnosis not present

## 2023-10-22 DIAGNOSIS — F411 Generalized anxiety disorder: Secondary | ICD-10-CM | POA: Diagnosis not present

## 2023-10-22 DIAGNOSIS — F901 Attention-deficit hyperactivity disorder, predominantly hyperactive type: Secondary | ICD-10-CM | POA: Diagnosis not present

## 2023-11-07 DIAGNOSIS — F1111 Opioid abuse, in remission: Secondary | ICD-10-CM | POA: Diagnosis not present

## 2023-11-07 DIAGNOSIS — F411 Generalized anxiety disorder: Secondary | ICD-10-CM | POA: Diagnosis not present

## 2023-11-07 DIAGNOSIS — F902 Attention-deficit hyperactivity disorder, combined type: Secondary | ICD-10-CM | POA: Diagnosis not present

## 2023-11-07 DIAGNOSIS — F321 Major depressive disorder, single episode, moderate: Secondary | ICD-10-CM | POA: Diagnosis not present

## 2023-11-17 DIAGNOSIS — F1120 Opioid dependence, uncomplicated: Secondary | ICD-10-CM | POA: Diagnosis not present

## 2023-11-19 DIAGNOSIS — F901 Attention-deficit hyperactivity disorder, predominantly hyperactive type: Secondary | ICD-10-CM | POA: Diagnosis not present

## 2023-11-19 DIAGNOSIS — F330 Major depressive disorder, recurrent, mild: Secondary | ICD-10-CM | POA: Diagnosis not present

## 2023-11-19 DIAGNOSIS — F411 Generalized anxiety disorder: Secondary | ICD-10-CM | POA: Diagnosis not present

## 2023-11-22 DIAGNOSIS — F1120 Opioid dependence, uncomplicated: Secondary | ICD-10-CM | POA: Diagnosis not present

## 2023-11-25 DIAGNOSIS — F1120 Opioid dependence, uncomplicated: Secondary | ICD-10-CM | POA: Diagnosis not present

## 2023-11-29 DIAGNOSIS — F1120 Opioid dependence, uncomplicated: Secondary | ICD-10-CM | POA: Diagnosis not present

## 2023-12-06 DIAGNOSIS — F1120 Opioid dependence, uncomplicated: Secondary | ICD-10-CM | POA: Diagnosis not present

## 2023-12-09 DIAGNOSIS — F1120 Opioid dependence, uncomplicated: Secondary | ICD-10-CM | POA: Diagnosis not present

## 2023-12-13 DIAGNOSIS — F1120 Opioid dependence, uncomplicated: Secondary | ICD-10-CM | POA: Diagnosis not present

## 2023-12-17 DIAGNOSIS — F330 Major depressive disorder, recurrent, mild: Secondary | ICD-10-CM | POA: Diagnosis not present

## 2023-12-17 DIAGNOSIS — F901 Attention-deficit hyperactivity disorder, predominantly hyperactive type: Secondary | ICD-10-CM | POA: Diagnosis not present

## 2023-12-17 DIAGNOSIS — F411 Generalized anxiety disorder: Secondary | ICD-10-CM | POA: Diagnosis not present

## 2023-12-18 DIAGNOSIS — F1120 Opioid dependence, uncomplicated: Secondary | ICD-10-CM | POA: Diagnosis not present

## 2023-12-21 DIAGNOSIS — F1120 Opioid dependence, uncomplicated: Secondary | ICD-10-CM | POA: Diagnosis not present

## 2023-12-26 DIAGNOSIS — F1120 Opioid dependence, uncomplicated: Secondary | ICD-10-CM | POA: Diagnosis not present

## 2024-01-03 DIAGNOSIS — F1120 Opioid dependence, uncomplicated: Secondary | ICD-10-CM | POA: Diagnosis not present

## 2024-01-10 DIAGNOSIS — F1120 Opioid dependence, uncomplicated: Secondary | ICD-10-CM | POA: Diagnosis not present

## 2024-01-13 DIAGNOSIS — F1120 Opioid dependence, uncomplicated: Secondary | ICD-10-CM | POA: Diagnosis not present

## 2024-01-14 DIAGNOSIS — F411 Generalized anxiety disorder: Secondary | ICD-10-CM | POA: Diagnosis not present

## 2024-01-14 DIAGNOSIS — F330 Major depressive disorder, recurrent, mild: Secondary | ICD-10-CM | POA: Diagnosis not present

## 2024-01-14 DIAGNOSIS — F901 Attention-deficit hyperactivity disorder, predominantly hyperactive type: Secondary | ICD-10-CM | POA: Diagnosis not present

## 2024-01-23 DIAGNOSIS — F1120 Opioid dependence, uncomplicated: Secondary | ICD-10-CM | POA: Diagnosis not present

## 2024-02-03 DIAGNOSIS — F1120 Opioid dependence, uncomplicated: Secondary | ICD-10-CM | POA: Diagnosis not present

## 2024-02-07 DIAGNOSIS — B182 Chronic viral hepatitis C: Secondary | ICD-10-CM | POA: Diagnosis not present

## 2024-02-10 DIAGNOSIS — F1120 Opioid dependence, uncomplicated: Secondary | ICD-10-CM | POA: Diagnosis not present

## 2024-02-11 DIAGNOSIS — B182 Chronic viral hepatitis C: Secondary | ICD-10-CM | POA: Diagnosis not present

## 2024-02-11 DIAGNOSIS — F901 Attention-deficit hyperactivity disorder, predominantly hyperactive type: Secondary | ICD-10-CM | POA: Diagnosis not present

## 2024-02-11 DIAGNOSIS — F411 Generalized anxiety disorder: Secondary | ICD-10-CM | POA: Diagnosis not present

## 2024-02-11 DIAGNOSIS — K838 Other specified diseases of biliary tract: Secondary | ICD-10-CM | POA: Diagnosis not present

## 2024-02-11 DIAGNOSIS — Z9049 Acquired absence of other specified parts of digestive tract: Secondary | ICD-10-CM | POA: Diagnosis not present

## 2024-02-11 DIAGNOSIS — F330 Major depressive disorder, recurrent, mild: Secondary | ICD-10-CM | POA: Diagnosis not present

## 2024-02-12 DIAGNOSIS — F1120 Opioid dependence, uncomplicated: Secondary | ICD-10-CM | POA: Diagnosis not present

## 2024-02-18 DIAGNOSIS — F1120 Opioid dependence, uncomplicated: Secondary | ICD-10-CM | POA: Diagnosis not present

## 2024-02-24 ENCOUNTER — Other Ambulatory Visit: Payer: Self-pay | Admitting: Gastroenterology

## 2024-02-24 DIAGNOSIS — K838 Other specified diseases of biliary tract: Secondary | ICD-10-CM

## 2024-02-27 DIAGNOSIS — F1120 Opioid dependence, uncomplicated: Secondary | ICD-10-CM | POA: Diagnosis not present

## 2024-03-10 DIAGNOSIS — F330 Major depressive disorder, recurrent, mild: Secondary | ICD-10-CM | POA: Diagnosis not present

## 2024-03-10 DIAGNOSIS — F901 Attention-deficit hyperactivity disorder, predominantly hyperactive type: Secondary | ICD-10-CM | POA: Diagnosis not present

## 2024-03-10 DIAGNOSIS — F411 Generalized anxiety disorder: Secondary | ICD-10-CM | POA: Diagnosis not present

## 2024-03-11 DIAGNOSIS — F1120 Opioid dependence, uncomplicated: Secondary | ICD-10-CM | POA: Diagnosis not present

## 2024-03-13 DIAGNOSIS — B1920 Unspecified viral hepatitis C without hepatic coma: Secondary | ICD-10-CM | POA: Diagnosis not present

## 2024-03-13 DIAGNOSIS — F1120 Opioid dependence, uncomplicated: Secondary | ICD-10-CM | POA: Diagnosis not present

## 2024-03-16 ENCOUNTER — Encounter: Payer: Self-pay | Admitting: Gastroenterology

## 2024-03-19 DIAGNOSIS — F1120 Opioid dependence, uncomplicated: Secondary | ICD-10-CM | POA: Diagnosis not present

## 2024-03-22 DIAGNOSIS — B1920 Unspecified viral hepatitis C without hepatic coma: Secondary | ICD-10-CM | POA: Diagnosis not present

## 2024-03-22 DIAGNOSIS — F1120 Opioid dependence, uncomplicated: Secondary | ICD-10-CM | POA: Diagnosis not present

## 2024-03-24 ENCOUNTER — Other Ambulatory Visit

## 2024-03-27 DIAGNOSIS — F1120 Opioid dependence, uncomplicated: Secondary | ICD-10-CM | POA: Diagnosis not present

## 2024-04-02 DIAGNOSIS — F1120 Opioid dependence, uncomplicated: Secondary | ICD-10-CM | POA: Diagnosis not present

## 2024-04-07 DIAGNOSIS — F411 Generalized anxiety disorder: Secondary | ICD-10-CM | POA: Diagnosis not present

## 2024-04-07 DIAGNOSIS — F901 Attention-deficit hyperactivity disorder, predominantly hyperactive type: Secondary | ICD-10-CM | POA: Diagnosis not present

## 2024-04-07 DIAGNOSIS — F330 Major depressive disorder, recurrent, mild: Secondary | ICD-10-CM | POA: Diagnosis not present

## 2024-04-13 DIAGNOSIS — F1120 Opioid dependence, uncomplicated: Secondary | ICD-10-CM | POA: Diagnosis not present

## 2024-04-17 DIAGNOSIS — F1120 Opioid dependence, uncomplicated: Secondary | ICD-10-CM | POA: Diagnosis not present

## 2024-04-21 DIAGNOSIS — F1120 Opioid dependence, uncomplicated: Secondary | ICD-10-CM | POA: Diagnosis not present
